# Patient Record
Sex: Female | Born: 1990 | Race: Black or African American | Hispanic: No | Marital: Single | Smoking: Never smoker
Health system: Southern US, Community
[De-identification: ages and names within clinical notes are randomized; demographics above are authoritative.]

## PROBLEM LIST (undated history)

## (undated) DIAGNOSIS — Z789 Other specified health status: Secondary | ICD-10-CM

## (undated) HISTORY — PX: NO PAST SURGERIES: SHX2092

---

## 2011-03-08 LAB — OB RESULTS CONSOLE GC/CHLAMYDIA: Chlamydia: NEGATIVE

## 2011-03-09 ENCOUNTER — Inpatient Hospital Stay (HOSPITAL_COMMUNITY): Admission: AD | Admit: 2011-03-09 | Payer: Self-pay | Source: Ambulatory Visit | Admitting: Obstetrics and Gynecology

## 2011-09-05 LAB — OB RESULTS CONSOLE RPR: RPR: NONREACTIVE

## 2011-09-05 LAB — OB RESULTS CONSOLE GC/CHLAMYDIA
Chlamydia: NEGATIVE
Chlamydia: NEGATIVE
Gonorrhea: NEGATIVE

## 2011-09-05 LAB — OB RESULTS CONSOLE HEPATITIS B SURFACE ANTIGEN: Hepatitis B Surface Ag: NEGATIVE

## 2011-09-05 LAB — OB RESULTS CONSOLE ANTIBODY SCREEN: Antibody Screen: NEGATIVE

## 2011-09-05 LAB — OB RESULTS CONSOLE HIV ANTIBODY (ROUTINE TESTING): HIV: NONREACTIVE

## 2011-09-05 LAB — OB RESULTS CONSOLE RUBELLA ANTIBODY, IGM: Rubella: IMMUNE

## 2012-01-21 LAB — OB RESULTS CONSOLE GBS: GBS: POSITIVE

## 2012-02-06 NOTE — L&D Delivery Note (Signed)
Delivery Note Pt progressed to complete and pushed well.  At 6:24 PM a viable female was delivered via Vaginal, Spontaneous Delivery (Presentation: Left Occiput Anterior).  APGAR: 8, 9; weight pending.   Placenta status: Intact, Spontaneous.  Cord: 3 vessels with the following complications: None.  Anesthesia: Epidural  Episiotomy: None Lacerations: 1st degree Suture Repair: 3.0 vicryl rapide Est. Blood Loss (mL): 300  Mom to postpartum.  Baby to stay with mom.  Daphnee Preiss D 02/20/2012, 6:51 PM

## 2012-02-20 ENCOUNTER — Encounter (HOSPITAL_COMMUNITY): Payer: Self-pay

## 2012-02-20 ENCOUNTER — Encounter (HOSPITAL_COMMUNITY): Payer: Self-pay | Admitting: Anesthesiology

## 2012-02-20 ENCOUNTER — Inpatient Hospital Stay (HOSPITAL_COMMUNITY): Payer: Medicaid Other | Admitting: Anesthesiology

## 2012-02-20 ENCOUNTER — Inpatient Hospital Stay (HOSPITAL_COMMUNITY)
Admission: RE | Admit: 2012-02-20 | Discharge: 2012-02-22 | DRG: 775 | Disposition: A | Discharge status: Home / Self Care | Payer: Medicaid Other | Source: Ambulatory Visit | Attending: Obstetrics and Gynecology | Admitting: Obstetrics and Gynecology

## 2012-02-20 DIAGNOSIS — Z22330 Carrier of Group B streptococcus: Secondary | ICD-10-CM

## 2012-02-20 DIAGNOSIS — Z3490 Encounter for supervision of normal pregnancy, unspecified, unspecified trimester: Secondary | ICD-10-CM

## 2012-02-20 DIAGNOSIS — O99892 Other specified diseases and conditions complicating childbirth: Secondary | ICD-10-CM | POA: Diagnosis present

## 2012-02-20 HISTORY — DX: Other specified health status: Z78.9

## 2012-02-20 LAB — CBC
HCT: 32.4 % — ABNORMAL LOW (ref 36.0–46.0)
MCV: 82.7 fL (ref 78.0–100.0)
RDW: 14 % (ref 11.5–15.5)
WBC: 10.6 10*3/uL — ABNORMAL HIGH (ref 4.0–10.5)

## 2012-02-20 MED ORDER — LIDOCAINE HCL (PF) 1 % IJ SOLN
30.0000 mL | INTRAMUSCULAR | Status: DC | PRN
  Filled 2012-02-20: qty 30

## 2012-02-20 MED ORDER — CITRIC ACID-SODIUM CITRATE 334-500 MG/5ML PO SOLN: 30.0000 mL | ORAL | Status: DC | PRN

## 2012-02-20 MED ORDER — PHENYLEPHRINE 40 MCG/ML (10ML) SYRINGE FOR IV PUSH (FOR BLOOD PRESSURE SUPPORT): 80.0000 ug | PREFILLED_SYRINGE | INTRAVENOUS | Status: DC | PRN

## 2012-02-20 MED ORDER — IBUPROFEN 600 MG PO TABS
600.0000 mg | ORAL_TABLET | Freq: Four times a day (QID) | ORAL | Status: DC
  Administered 2012-02-21 – 2012-02-22 (×6): 600 mg via ORAL
  Filled 2012-02-20 (×6): qty 1

## 2012-02-20 MED ORDER — LACTATED RINGERS IV SOLN
500.0000 mL | Freq: Once | INTRAVENOUS | Status: DC
Start: 2012-02-20 — End: 2012-02-20

## 2012-02-20 MED ORDER — ZOLPIDEM TARTRATE 5 MG PO TABS: 5.0000 mg | ORAL_TABLET | Freq: Every evening | ORAL | Status: DC | PRN

## 2012-02-20 MED ORDER — DIBUCAINE 1 % RE OINT: 1.0000 "application " | TOPICAL_OINTMENT | RECTAL | Status: DC | PRN

## 2012-02-20 MED ORDER — PRENATAL MULTIVITAMIN CH
1.0000 | ORAL_TABLET | Freq: Every day | ORAL | Status: DC
  Administered 2012-02-21 – 2012-02-22 (×2): 1 via ORAL
  Filled 2012-02-20 (×2): qty 1

## 2012-02-20 MED ORDER — ONDANSETRON HCL 4 MG PO TABS: 4.0000 mg | ORAL_TABLET | ORAL | Status: DC | PRN

## 2012-02-20 MED ORDER — DIPHENHYDRAMINE HCL 50 MG/ML IJ SOLN: 12.5000 mg | INTRAMUSCULAR | Status: DC | PRN

## 2012-02-20 MED ORDER — MEASLES, MUMPS & RUBELLA VAC ~~LOC~~ INJ: 0.5000 mL | INJECTION | Freq: Once | SUBCUTANEOUS | Status: DC

## 2012-02-20 MED ORDER — BENZOCAINE-MENTHOL 20-0.5 % EX AERO
1.0000 "application " | INHALATION_SPRAY | CUTANEOUS | Status: DC | PRN
  Administered 2012-02-21: 1 via TOPICAL
  Filled 2012-02-20: qty 56

## 2012-02-20 MED ORDER — ONDANSETRON HCL 4 MG/2ML IJ SOLN: 4.0000 mg | Freq: Four times a day (QID) | INTRAMUSCULAR | Status: DC | PRN

## 2012-02-20 MED ORDER — OXYTOCIN 40 UNITS IN LACTATED RINGERS INFUSION - SIMPLE MED
1.0000 m[IU]/min | INTRAVENOUS | Status: DC
  Administered 2012-02-20: 2 m[IU]/min via INTRAVENOUS
  Filled 2012-02-20: qty 1000

## 2012-02-20 MED ORDER — IBUPROFEN 600 MG PO TABS: 600.0000 mg | ORAL_TABLET | Freq: Four times a day (QID) | ORAL | Status: DC | PRN

## 2012-02-20 MED ORDER — PHENYLEPHRINE 40 MCG/ML (10ML) SYRINGE FOR IV PUSH (FOR BLOOD PRESSURE SUPPORT)
80.0000 ug | PREFILLED_SYRINGE | INTRAVENOUS | Status: DC | PRN
  Filled 2012-02-20: qty 5

## 2012-02-20 MED ORDER — LACTATED RINGERS IV SOLN
INTRAVENOUS | Status: DC
  Administered 2012-02-20: 09:00:00 via INTRAVENOUS

## 2012-02-20 MED ORDER — EPHEDRINE 5 MG/ML INJ
10.0000 mg | INTRAVENOUS | Status: DC | PRN
  Filled 2012-02-20: qty 4

## 2012-02-20 MED ORDER — SIMETHICONE 80 MG PO CHEW: 80.0000 mg | CHEWABLE_TABLET | ORAL | Status: DC | PRN

## 2012-02-20 MED ORDER — PENICILLIN G POTASSIUM 5000000 UNITS IJ SOLR
2.5000 10*6.[IU] | INTRAVENOUS | Status: DC
  Administered 2012-02-20: 2.5 10*6.[IU] via INTRAVENOUS
  Filled 2012-02-20 (×5): qty 2.5

## 2012-02-20 MED ORDER — LACTATED RINGERS IV SOLN: 500.0000 mL | INTRAVENOUS | Status: DC | PRN

## 2012-02-20 MED ORDER — TERBUTALINE SULFATE 1 MG/ML IJ SOLN: 0.2500 mg | Freq: Once | INTRAMUSCULAR | Status: DC | PRN

## 2012-02-20 MED ORDER — FENTANYL 2.5 MCG/ML BUPIVACAINE 1/10 % EPIDURAL INFUSION (WH - ANES)
14.0000 mL/h | INTRAMUSCULAR | Status: DC
Start: 2012-02-20 — End: 2012-02-20
  Administered 2012-02-20: 14 mL/h via EPIDURAL
  Filled 2012-02-20: qty 125

## 2012-02-20 MED ORDER — BUTORPHANOL TARTRATE 1 MG/ML IJ SOLN
1.0000 mg | INTRAMUSCULAR | Status: DC | PRN
  Administered 2012-02-20 (×2): 1 mg via INTRAVENOUS
  Filled 2012-02-20 (×2): qty 1

## 2012-02-20 MED ORDER — OXYCODONE-ACETAMINOPHEN 5-325 MG PO TABS
1.0000 | ORAL_TABLET | ORAL | Status: DC | PRN
  Administered 2012-02-21 (×2): 1 via ORAL
  Filled 2012-02-20 (×2): qty 1

## 2012-02-20 MED ORDER — TETANUS-DIPHTH-ACELL PERTUSSIS 5-2.5-18.5 LF-MCG/0.5 IM SUSP: 0.5000 mL | Freq: Once | INTRAMUSCULAR | Status: DC

## 2012-02-20 MED ORDER — OXYCODONE-ACETAMINOPHEN 5-325 MG PO TABS: 1.0000 | ORAL_TABLET | ORAL | Status: DC | PRN

## 2012-02-20 MED ORDER — MAGNESIUM HYDROXIDE 400 MG/5ML PO SUSP: 30.0000 mL | ORAL | Status: DC | PRN

## 2012-02-20 MED ORDER — SENNOSIDES-DOCUSATE SODIUM 8.6-50 MG PO TABS
2.0000 | ORAL_TABLET | Freq: Every day | ORAL | Status: DC
  Administered 2012-02-20 – 2012-02-21 (×2): 2 via ORAL

## 2012-02-20 MED ORDER — ONDANSETRON HCL 4 MG/2ML IJ SOLN: 4.0000 mg | INTRAMUSCULAR | Status: DC | PRN

## 2012-02-20 MED ORDER — DIPHENHYDRAMINE HCL 25 MG PO CAPS: 25.0000 mg | ORAL_CAPSULE | Freq: Four times a day (QID) | ORAL | Status: DC | PRN

## 2012-02-20 MED ORDER — METHYLERGONOVINE MALEATE 0.2 MG PO TABS: 0.2000 mg | ORAL_TABLET | ORAL | Status: DC | PRN

## 2012-02-20 MED ORDER — PENICILLIN G POTASSIUM 5000000 UNITS IJ SOLR
5.0000 10*6.[IU] | Freq: Once | INTRAVENOUS | Status: AC
  Administered 2012-02-20: 5 10*6.[IU] via INTRAVENOUS
  Filled 2012-02-20: qty 5

## 2012-02-20 MED ORDER — WITCH HAZEL-GLYCERIN EX PADS: 1.0000 "application " | MEDICATED_PAD | CUTANEOUS | Status: DC | PRN

## 2012-02-20 MED ORDER — METHYLERGONOVINE MALEATE 0.2 MG/ML IJ SOLN: 0.2000 mg | INTRAMUSCULAR | Status: DC | PRN

## 2012-02-20 MED ORDER — ACETAMINOPHEN 325 MG PO TABS: 650.0000 mg | ORAL_TABLET | ORAL | Status: DC | PRN

## 2012-02-20 MED ORDER — LANOLIN HYDROUS EX OINT: TOPICAL_OINTMENT | CUTANEOUS | Status: DC | PRN

## 2012-02-20 MED ORDER — LIDOCAINE HCL (PF) 1 % IJ SOLN
INTRAMUSCULAR | Status: DC | PRN
  Administered 2012-02-20 (×4): 4 mL

## 2012-02-20 MED ORDER — EPHEDRINE 5 MG/ML INJ: 10.0000 mg | INTRAVENOUS | Status: DC | PRN

## 2012-02-20 MED ORDER — OXYTOCIN BOLUS FROM INFUSION: 500.0000 mL | INTRAVENOUS | Status: DC

## 2012-02-20 MED ORDER — OXYTOCIN 40 UNITS IN LACTATED RINGERS INFUSION - SIMPLE MED
62.5000 mL/h | INTRAVENOUS | Status: DC
  Administered 2012-02-20: 62.5 mL/h via INTRAVENOUS

## 2012-02-20 NOTE — Anesthesia Preprocedure Evaluation (Signed)

## 2012-02-20 NOTE — Progress Notes (Signed)
Comfortable with epidural Afeb, VSS FHT- Cat II, occ variable, ctx q 2-3 min VE- Rim/C/+1 Continue pitocin, anticipate SVD

## 2012-02-20 NOTE — H&P (Signed)
Gina Blair is a 22 y.o. female, G1 P0, EGA 40+ weeks with [facility] 1-12 presenting for elective induction.  Prenatal care essentially uncomplicated, see prenatal records for complete history.  Maternal Medical History:  Fetal activity: Perceived fetal activity is normal.    Prenatal complications: no prenatal complications   OB History    Grav Para Term Preterm Abortions TAB SAB Ect Mult Living   1 0 0 0 0 0 0 0 0 0      Past Medical History  Diagnosis Date  . No pertinent past medical history    Past Surgical History  Procedure Date  . No past surgeries    Family History: family history is not on file. Social History:  reports that she has never smoked. She does not have any smokeless tobacco history on file. She reports that she does not drink alcohol or use illicit drugs.   Prenatal Transfer Tool  Maternal Diabetes: No Genetic Screening: Normal Maternal Ultrasounds/Referrals: Normal Fetal Ultrasounds or other Referrals:  None Maternal Substance Abuse:  No Significant Maternal Medications:  None Significant Maternal Lab Results:  Lab values include: Group B Strep positive Other Comments:  None  Review of Systems  Respiratory: Negative.   Cardiovascular: Negative.    AROM clear Dilation: 2.5 Effacement (%): 90 Station: -1 Exam by:: J.Thornton, RN Blood pressure 103/51, pulse 114, temperature 98 F (36.7 C), temperature source Oral, resp. rate 18, height 5\' 3"  (1.6 m), weight 68.04 kg (150 lb), last menstrual period 05/13/2011. Maternal Exam:  Uterine Assessment: Contraction strength is moderate.  Contraction frequency is irregular.   Abdomen: Patient reports no abdominal tenderness. Estimated fetal weight is 7 lbs.   Fetal presentation: vertex  Introitus: Normal vulva. Normal vagina.  Amniotic fluid character: clear.  Pelvis: adequate for delivery.   Cervix: Cervix evaluated by digital exam.     Fetal Exam Fetal Monitor Review: Mode: ultrasound.     Baseline rate: 140.  Variability: moderate (6-25 bpm).   Pattern: accelerations present and no decelerations.    Fetal State Assessment: Category I - tracings are normal.     Physical Exam  Constitutional: She appears well-developed and well-nourished.  Cardiovascular: Normal rate, regular rhythm and normal heart sounds.   No murmur heard. Respiratory: Effort normal and breath sounds normal. No respiratory distress. She has no wheezes.  GI: Soft.       Gravid     Prenatal labs: ABO, Rh: B/Positive/-- (07/31 0000) Antibody: Negative (07/31 0000) Rubella: Immune (07/31 0000) RPR: Nonreactive (07/31 0000)  HBsAg: Negative (07/31 0000)  HIV: Non-reactive (07/31 0000)  GBS: Positive, Positive (12/16 0000)  GCT:  Nl  Assessment/Plan: IUP at 40+ weeks for induction.  On pitocin, AROM done, monitor progress and anticipate SVD.   Gina Blair D 02/20/2012, 12:20 PM

## 2012-02-20 NOTE — Anesthesia Procedure Notes (Signed)
Epidural Patient location during procedure: OB Start time: 02/20/2012 3:43 PM  Staffing Performed by: anesthesiologist   Preanesthetic Checklist Completed: patient identified, site marked, surgical consent, pre-op evaluation, timeout performed, IV checked, risks and benefits discussed and monitors and equipment checked  Epidural Patient position: sitting Prep: site prepped and draped and DuraPrep Patient monitoring: continuous pulse ox and blood pressure Approach: midline Injection technique: LOR air  Needle:  Needle type: Tuohy  Needle gauge: 17 G Needle length: 9 cm and 9 Needle insertion depth: 5 cm cm Catheter type: closed end flexible Catheter size: 19 Gauge Catheter at skin depth: 10 cm Test dose: negative  Assessment Events: blood not aspirated, injection not painful, no injection resistance, negative IV test and no paresthesia  Additional Notes Discussed risk of headache, infection, bleeding, nerve injury and failed or incomplete block.  Patient voices understanding and wishes to proceed. Reason for block:procedure for pain

## 2012-02-21 NOTE — Progress Notes (Signed)
Ur chart review completed.  

## 2012-02-21 NOTE — Progress Notes (Signed)
PPD #1 No problems Afeb, VSS Fundus firm, NT at U-1 Continue routine postpartum care 

## 2012-02-21 NOTE — Anesthesia Postprocedure Evaluation (Signed)
  Anesthesia Post-op Note  Patient: Gina Blair  Procedure(s) Performed: * No procedures listed *  Patient Location: Mother/Baby  Anesthesia Type:Epidural  Level of Consciousness: awake  Airway and Oxygen Therapy: Patient Spontanous Breathing  Post-op Pain: none  Post-op Assessment: Patient's Cardiovascular Status Stable, Respiratory Function Stable, Patent Airway, No signs of Nausea or vomiting, Adequate PO intake, Pain level controlled, No headache, No backache, No residual numbness and No residual motor weakness  Post-op Vital Signs: Reviewed and stable  Complications: No apparent anesthesia complications

## 2012-02-22 MED ORDER — IBUPROFEN 600 MG PO TABS: 600.0000 mg | ORAL_TABLET | Freq: Four times a day (QID) | ORAL | Status: DC

## 2012-02-22 MED ORDER — OXYCODONE-ACETAMINOPHEN 5-325 MG PO TABS: 1.0000 | ORAL_TABLET | ORAL | Status: DC | PRN

## 2012-02-22 NOTE — Discharge Summary (Signed)
Obstetric Discharge Summary Reason for Admission: induction of labor Prenatal Procedures: none Intrapartum Procedures: spontaneous vaginal delivery Postpartum Procedures: none Complications-Operative and Postpartum: 1st degree perineal laceration Hemoglobin  Date Value Range Status  02/20/2012 10.5* 12.0 - 15.0 g/dL Final     HCT  Date Value Range Status  02/20/2012 32.4* 36.0 - 46.0 % Final    Physical Exam:  General: alert Lochia: appropriate Uterine Fundus: firm  Discharge Diagnoses: Term Pregnancy-delivered  Discharge Information: Date: 02/22/2012 Activity: pelvic rest Diet: routine Medications: Ibuprofen and Percocet Condition: stable Instructions: refer to practice specific booklet Discharge to: home Follow-up Information    Follow up with Keyanah Kozicki D, MD. Schedule an appointment as soon as possible for a visit in 6 weeks.   Contact information:   [address] [place] [address] [phone]          Newborn Data: Live born female  Birth Weight: 7 lb 1.4 oz (3215 g) APGAR: 8, 9  Home with mother.  Gina Blair D 02/22/2012, 8:30 AM

## 2012-02-22 NOTE — Progress Notes (Signed)
PPD #2 No problems Afeb, VSS D/c home 

## 2013-12-07 ENCOUNTER — Encounter (HOSPITAL_COMMUNITY): Payer: Self-pay

## 2013-12-21 ENCOUNTER — Emergency Department (HOSPITAL_COMMUNITY)
Admission: EM | Admit: 2013-12-21 | Discharge: 2013-12-22 | Disposition: A | Discharge status: Home / Self Care | Payer: Self-pay | Attending: Emergency Medicine | Admitting: Emergency Medicine

## 2013-12-21 ENCOUNTER — Encounter (HOSPITAL_COMMUNITY): Payer: Self-pay | Admitting: Emergency Medicine

## 2013-12-21 DIAGNOSIS — Y9389 Activity, other specified: Secondary | ICD-10-CM | POA: Insufficient documentation

## 2013-12-21 DIAGNOSIS — Y998 Other external cause status: Secondary | ICD-10-CM | POA: Insufficient documentation

## 2013-12-21 DIAGNOSIS — S0993XA Unspecified injury of face, initial encounter: Secondary | ICD-10-CM

## 2013-12-21 DIAGNOSIS — W228XXA Striking against or struck by other objects, initial encounter: Secondary | ICD-10-CM | POA: Insufficient documentation

## 2013-12-21 DIAGNOSIS — S0230XA Fracture of orbital floor, unspecified side, initial encounter for closed fracture: Secondary | ICD-10-CM

## 2013-12-21 DIAGNOSIS — S023XXA Fracture of orbital floor, initial encounter for closed fracture: Secondary | ICD-10-CM | POA: Insufficient documentation

## 2013-12-21 DIAGNOSIS — Y9289 Other specified places as the place of occurrence of the external cause: Secondary | ICD-10-CM | POA: Insufficient documentation

## 2013-12-21 NOTE — ED Notes (Signed)
Bruising noted around left eye. Patient denies vision changes.  Reports light sensitivity has increased since incident four days ago.

## 2013-12-21 NOTE — ED Notes (Signed)
Pt states 3 days ago as she was getting in her car she caught her eye on the car door causing a laceration to her eyebrow area  Pt states her eye immediately swelled and bruised  Pt states she has been using ice on it and it has gotten better but she just wants it looked at to be sure it is ok

## 2013-12-21 NOTE — ED Provider Notes (Signed)
CSN: [id number]     Arrival date & time 12/21/13  2235 History   None    Chief Complaint  Patient [id number] with  . Eye Injury    Patient is a 23 y.o. female presenting with eye injury. The history is provided by the patient. No language interpreter was used.  Eye Injury Pertinent negatives include no headaches.   This chart was scribed for non-physician practitioner, Elpidio AnisShari Jahi Roza, PA-C working with Loren Raceravid Yelverton, MD, by Andrew Auaven Small, ED Scribe. This patient was seen in room WTR6/WTR6 and the patient's care was started at 11:50 PM.  Gina Blair is a 23 y.o. female who presents to the Emergency Department complaining of left eye injury that occurred 3 days ago. Pt states she hit her eye on the car door 3 days ago and her eye instantly went numb. She reports shortly after eye began to swell. Pt reports a pulling sensation when looking up. Pt reports associated eye redness, eye swelling, photophobia, and bruising. She denies injury else where.     Past Medical History  Diagnosis Date  . No pertinent past medical history    Past Surgical History  Procedure Laterality Date  . No past surgeries     History reviewed. No pertinent family history. History  Substance Use Topics  . Smoking status: Never Smoker   . Smokeless tobacco: Not on file  . Alcohol Use: Yes     Comment: occ   OB History    Gravida Para Term Preterm AB TAB SAB Ectopic Multiple Living   1 1 1  0 0 0 0 0 0 1     Review of Systems  Constitutional: Negative for fever.  Eyes: Positive for photophobia and redness.  Gastrointestinal: Negative for nausea and vomiting.  Musculoskeletal: Negative for neck pain.  Skin: Positive for color change.  Neurological: Negative for headaches.   Allergies  Review of patient's allergies indicates no known allergies.  Home Medications   Prior to Admission medications   Medication Sig Start Date End Date Taking? Authorizing Provider  ibuprofen (ADVIL,MOTRIN) 200 MG  tablet Take 400 mg by mouth every 6 (six) hours as needed (for pain.).   Yes Historical Provider, MD  ibuprofen (ADVIL,MOTRIN) 600 MG tablet Take 1 tablet (600 mg total) by mouth every 6 (six) hours. 02/22/12   Lavina Hammanodd Meisinger, MD  oxyCODONE-acetaminophen (PERCOCET/ROXICET) 5-325 MG per tablet Take 1-2 tablets by mouth every 4 (four) hours as needed (moderate - severe pain). 02/22/12   Lavina Hammanodd Meisinger, MD   BP 121/70 mmHg  Pulse 89  Temp(Src) 97.7 F (36.5 C) (Oral)  Resp 18  Ht 5\' 3"  (1.6 m)  Wt 120 lb (54.432 kg)  BMI 21.26 kg/m2  SpO2 98%  LMP 12/13/2013 (Approximate) Physical Exam  Constitutional: She is oriented to person, place, and time. She appears well-developed and well-nourished. No distress.  HENT:  Head: Normocephalic and atraumatic.  Eyes: Conjunctivae and EOM are normal.  Left eye has healing laceration to upper aspect of eyebrow. Ecchymosis to upper and lower lids and subconjunctival hemorrhage. cornea is clear. pupil painfully reactive but equal. No hyphema. Full ROM of EO muscles.  Neck: Neck supple.  Cardiovascular: Normal rate.   Pulmonary/Chest: Effort normal.  Musculoskeletal: Normal range of motion.  Neurological: She is alert and oriented to person, place, and time.  Skin: Skin is warm and dry.  Psychiatric: She has a normal mood and affect. Her behavior is normal.  Nursing note and vitals reviewed.   ED Course  Procedures (including critical care time) Labs Review Labs Reviewed - No data to display  Imaging Review No results found.   EKG Interpretation None     Ct Orbitss W/o Cm  12/22/2013   CLINICAL DATA:  Left orbital contusion following hitting face with car door, initial encounter  EXAM: CT ORBITS WITHOUT CONTRAST  TECHNIQUE: Multidetector CT imaging of the orbits was performed following the standard protocol without intravenous contrast.  COMPARISON:  None.  FINDINGS: There are changes consistent with a blowout fracture of the left orbit  involving the inferior wall and extending into the left maxillary antrum. There is no entrapment of the inferior rectus muscle. A small amount of herniated fat is seen. Air is noted within the left orbit related to the injury. Paranasal sinuses are otherwise within normal limits with the exception of a few small mucosal retention cysts in the maxillary antra bilaterally. The mastoid air cells are within normal limits. No other fracture is seen.  IMPRESSION: Left orbital blowout fracture without entrapment of the inferior rectus muscle. A small amount of herniated fat is seen. Air is noted within the left orbit consistent with the fracture. The globe is intact.   Electronically Signed   By: Alcide CleverMark  Lukens M.D.   On: 12/22/2013 00:34    MDM   Final diagnoses:  None   1. Left orbital blowout fracture with entrapment  Discussed with Dr. Emeline DarlingGore who advises patient can follow up in the office in the morning given that injury is now 714 days old. Care plan discussed with patient who is agreeable with plan.   I personally performed the services described in this documentation, which was scribed in my presence. The recorded information has been reviewed and is accurate.     Gina HookerShari A Pedrohenrique Mcconville, PA-C 12/23/13 0316  Loren Raceravid Yelverton, MD 12/26/13 [phone]

## 2013-12-22 ENCOUNTER — Emergency Department (HOSPITAL_COMMUNITY): Payer: Self-pay

## 2013-12-22 MED ORDER — HYDROCODONE-ACETAMINOPHEN 5-325 MG PO TABS
1.0000 | ORAL_TABLET | ORAL | Status: DC | PRN
Start: 2013-12-22 — End: 2020-08-20

## 2013-12-22 NOTE — ED Notes (Addendum)
Patient transported to CT 

## 2013-12-22 NOTE — ED Notes (Addendum)
Back from CT

## 2013-12-22 NOTE — Discharge Instructions (Signed)
Orbital Floor Fracture, Blowout  °The eye sits in the bony structure of the skull called the orbit. The upper and outside walls of the orbit are very thick and strong. These walls protect the eye if the head is struck from the top or side of the eye. However, the inside wall near the nose and the orbit floor are very thin and weak. The bony floor of the orbit also acts as the roof of the air-filled space (sinus) below the orbit.  ° ° °If the eye receives a direct blow from the front, all the tissues around the eye are briefly pressed together. This makes the orbital wall pressure very high. Since the weakest walls tend to give way first, the inside wall or the orbit floor may break. If the floor fractures, the tissues around the eye, including the muscle that is used to make the eye look down, may become trapped within the fracture as the floor of the orbit "blows out" into the sinus below.  °CAUSES  °Orbital floor fractures are caused by direct (blunt) trauma to the region of the eye.  °SYMPTOMS  °Assuming that there has been no injury to the eye itself, symptoms can include:  °Puffiness (swelling) and bruising around the eye area (black eye).  °A gurgling sound when pressure is placed on the eye area. This sound comes from air that has escaped from the sinus into the space around the eye (orbital emphysema).  °Seeing two of everything - one object being higher than the other (vertical diplopia). This is the result of the muscle that moves the eye down being trapped within the fracture. Since it cannot relax, the eye is being held in a downward position relative to the other eye and cannot look up. Vertical diplopia from an orbital floor fracture is worse when looking up.  °Pain around the eye when looking up.  °One eye looks sunken compared to the other eye (enophthalmos).  °Numbness of the cheek and upper gum on the same side of the face with the floor fracture. This is a result of nerve injury to these areas.  This nerve runs in a groove along the bone of the orbital floor on its way to the cheek and upper gums. °DIAGNOSIS  °The diagnosis of an orbital floor fracture is suspected during an eye exam by an ophthalmologist. It is confirmed by X-rays or CT scan of the eye region.  °TREATMENT  °Orbital floor fractures are not usually treated until all of the swelling around the eye has gone away. This may take 1 or 2 weeks. Once the swelling has gone down, an ophthalmologist will see if if the muscle below the eye is still trapped within the fracture.  °If there is no sign of a trapped muscle or vertical diplopia, treatment is not necessary.  °If there is double vision only when looking up, a decision may be made to not do anything since most people do not spend a lot of time looking up. This may depend on the person's profession. For instance, a plumber or electrician may spend a large part of their day looking up and would therefore need treatment.  °If there is persistent vertical double vision even when looking straight ahead, the ophthalmologist may try to free the muscle in the office. If this is unsuccessful, surgery is often needed. °SEEK IMMEDIATE MEDICAL CARE IF:  °You have had a blow to the region of your eyes and have:  °A drop in vision in   either eye.  °Swelling and bruising around either eye.  °One eye seems to be "sunken" compared to the other.  °You see two of everything with both eyes open when looking in any direction.  °The two images get further apart when looking in a certain direction - especially up.  °You have numbness of the cheek and upper gums on the side of the injury.  °You develop an unexplained oral temperature over 102° F (38.9° C), or as your caregiver suggests. °Document Released: 07/18/2000 Document Revised: 04/16/2011 Document Reviewed: 03/26/2013  °Exit[employer]® Patient Information ©2015 [employer]. This information is not intended to replace advice given to you by your health care provider.  Make sure you discuss any questions you have with your health care provider.  ° ° °

## 2020-08-19 ENCOUNTER — Other Ambulatory Visit: Payer: Self-pay

## 2020-08-19 ENCOUNTER — Encounter (HOSPITAL_COMMUNITY): Payer: Self-pay

## 2020-08-19 ENCOUNTER — Emergency Department (HOSPITAL_COMMUNITY)
Admission: EM | Admit: 2020-08-19 | Discharge: 2020-08-19 | Disposition: A | Discharge status: Home / Self Care | Payer: Self-pay | Attending: Emergency Medicine | Admitting: Emergency Medicine

## 2020-08-19 ENCOUNTER — Emergency Department (HOSPITAL_COMMUNITY)
Admission: EM | Admit: 2020-08-19 | Discharge: 2020-08-20 | Disposition: A | Discharge status: Home / Self Care | Payer: Self-pay | Attending: Emergency Medicine | Admitting: Emergency Medicine

## 2020-08-19 DIAGNOSIS — Z5321 Procedure and treatment not carried out due to patient leaving prior to being seen by health care provider: Secondary | ICD-10-CM | POA: Insufficient documentation

## 2020-08-19 DIAGNOSIS — N611 Abscess of the breast and nipple: Secondary | ICD-10-CM | POA: Insufficient documentation

## 2020-08-19 DIAGNOSIS — N6459 Other signs and symptoms in breast: Secondary | ICD-10-CM | POA: Insufficient documentation

## 2020-08-19 DIAGNOSIS — L0291 Cutaneous abscess, unspecified: Secondary | ICD-10-CM

## 2020-08-19 LAB — BASIC METABOLIC PANEL
Anion gap: 9 (ref 5–15)
BUN: 7 mg/dL (ref 6–20)
CO2: 21 mmol/L — ABNORMAL LOW (ref 22–32)
Calcium: 9.6 mg/dL (ref 8.9–10.3)
Chloride: 110 mmol/L (ref 98–111)
Creatinine, Ser: 0.56 mg/dL (ref 0.44–1.00)
GFR, Estimated: >60 mL/min (ref >60)
Glucose, Bld: 80 mg/dL (ref 70–99)
Potassium: 3.4 mmol/L — ABNORMAL LOW (ref 3.5–5.1)
Sodium: 140 mmol/L (ref 135–145)

## 2020-08-19 LAB — CBC WITH DIFFERENTIAL/PLATELET
Abs Immature Granulocytes: 0.01 10*3/uL (ref 0.00–0.07)
Basophils Absolute: 0 10*3/uL (ref 0.0–0.1)
Basophils Relative: 1 %
Eosinophils Absolute: 0.1 10*3/uL (ref 0.0–0.5)
Eosinophils Relative: 1 %
HCT: 41.7 % (ref 36.0–46.0)
Hemoglobin: 13.8 g/dL (ref 12.0–15.0)
Immature Granulocytes: 0 %
Lymphocytes Relative: 31 %
Lymphs Abs: 2.7 10*3/uL (ref 0.7–4.0)
MCH: 32 pg (ref 26.0–34.0)
MCHC: 33.1 g/dL (ref 30.0–36.0)
MCV: 96.8 fL (ref 80.0–100.0)
Monocytes Absolute: 0.7 10*3/uL (ref 0.1–1.0)
Monocytes Relative: 8 %
Neutro Abs: 5 10*3/uL (ref 1.7–7.7)
Neutrophils Relative %: 59 %
Platelets: 304 10*3/uL (ref 150–400)
RBC: 4.31 MIL/uL (ref 3.87–5.11)
RDW: 12.5 % (ref 11.5–15.5)
WBC: 8.5 10*3/uL (ref 4.0–10.5)
nRBC: 0 % (ref 0.0–0.2)

## 2020-08-19 LAB — I-STAT BETA HCG BLOOD, ED (MC, WL, AP ONLY): I-stat hCG, quantitative: <5 m[IU]/mL (ref <5)

## 2020-08-19 NOTE — ED Provider Notes (Signed)
Emergency Medicine Provider Triage Evaluation Note  Gina Blair , a 30 y.o. female  was evaluated in triage.  Pt complains of right breast pain associated with erythema x3 days. She was evaluated at [facility] prior to arrival and sent to the ED due to concerns about a possible abscess. No fever or chills. She had her nipples pierced 2 years ago. She endorses drainage from piercing. She is not currently breastfeeding.  Review of Systems  Positive: Color change Negative: fever  Physical Exam  BP 112/65   Pulse 83   Temp 98.9 F (37.2 C) (Oral)   Resp 16   SpO2 96%  Gen:   Awake, no distress   Resp:  Normal effort  MSK:   Moves extremities without difficulty  Other:  Mild erythema to right breast surrounding areola  Medical Decision Making  Medically screening exam initiated at 9:49 PM.  Appropriate orders placed.  FOSTER SONNIER was informed that the remainder of the evaluation will be completed by another provider, this initial triage assessment does not replace that evaluation, and the importance of remaining in the ED until their evaluation is complete.     Gina Blair 08/19/20 2152    Pricilla Loveless, MD 08/23/20 [phone]

## 2020-08-19 NOTE — ED Triage Notes (Signed)
Pt came in with R breast pain and redness. Started approx three days ago. D/c noted

## 2020-08-19 NOTE — ED Notes (Signed)
Patient gave registration her stickers and said she was leaving. Registration brought stickers back to RN.

## 2020-08-19 NOTE — ED Triage Notes (Signed)
Patient has inflammation, swelling, drainage out of her right breast. Patient has had nipple piercing's in both breast 2 years ago. Pain started 2 days ago and she took ibuprofen but it did not improve the pain. Patient has not changed the piercing's recently.

## 2020-08-20 ENCOUNTER — Emergency Department (HOSPITAL_COMMUNITY): Payer: Self-pay

## 2020-08-20 MED ORDER — DOXYCYCLINE HYCLATE 100 MG PO CAPS: 100.0000 mg | ORAL_CAPSULE | Freq: Two times a day (BID) | ORAL | 0 refills | Status: AC

## 2020-08-20 MED ORDER — OXYCODONE-ACETAMINOPHEN 5-325 MG PO TABS: 1.0000 | ORAL_TABLET | Freq: Four times a day (QID) | ORAL | 0 refills | Status: AC | PRN

## 2020-08-20 MED ORDER — OXYCODONE-ACETAMINOPHEN 5-325 MG PO TABS
1.0000 | ORAL_TABLET | Freq: Once | ORAL | Status: AC
  Administered 2020-08-20: 1 via ORAL
  Filled 2020-08-20: qty 1

## 2020-08-20 MED ORDER — DOXYCYCLINE HYCLATE 100 MG PO TABS
100.0000 mg | ORAL_TABLET | Freq: Once | ORAL | Status: AC
  Administered 2020-08-20: 100 mg via ORAL
  Filled 2020-08-20: qty 1

## 2020-08-20 NOTE — ED Notes (Signed)
Ultrasound at bedside

## 2020-08-20 NOTE — ED Provider Notes (Signed)
[facility]-EMERGENCY DEPT Provider Note: Lowella Dell, MD, FACEP  CSN: [id number] MRN: [id number] ARRIVAL: 08/19/20 at 2100 ROOM: WA16/WA16   CHIEF COMPLAINT  Breast Pain   HISTORY OF PRESENT ILLNESS  08/20/20 12:22 AM Gina Blair is a 30 y.o. female with about a 1 week history of breast pain that is worsened over the past 3 days.  The pain is located in the subareolar region and is associated with some erythema and induration.  She is also had some brownish nipple discharge.  She rates the pain is an 8 out of 10, worse with palpation.  She has not had fever or chills.  She was seen at an urgent care earlier and was sent here for evaluation for an abscess.  She is not currently lactating.  She has nipple piercings but these were placed 2 years ago.   Past Medical History:  Diagnosis Date   No pertinent past medical history     Past Surgical History:  Procedure Laterality Date   NO PAST SURGERIES      No family history on file.  Social History   Tobacco Use   Smoking status: Never  Substance Use Topics   Alcohol use: Yes    Comment: occ   Drug use: No    Prior to Admission medications   Medication Sig Start Date End Date Taking? Authorizing Provider  HYDROcodone-acetaminophen (NORCO/VICODIN) 5-325 MG per tablet Take 1-2 tablets by mouth every 4 (four) hours as needed. 12/22/13   Elpidio Anis, PA-C  ibuprofen (ADVIL,MOTRIN) 200 MG tablet Take 400 mg by mouth every 6 (six) hours as needed (for pain.).    [provider]  ibuprofen (ADVIL,MOTRIN) 600 MG tablet Take 1 tablet (600 mg total) by mouth every 6 (six) hours. 02/22/12   Meisinger, Tawanna Cooler, MD  oxyCODONE-acetaminophen (PERCOCET/ROXICET) 5-325 MG per tablet Take 1-2 tablets by mouth every 4 (four) hours as needed (moderate - severe pain). 02/22/12   Meisinger, Tawanna Cooler, MD    Allergies Patient has no known allergies.   REVIEW OF SYSTEMS  Negative except as noted here or in the History of Present  Illness.   PHYSICAL EXAMINATION  Initial Vital Signs Blood pressure 117/82, pulse 73, temperature 98.9 F (37.2 C), temperature source Oral, resp. rate 16, SpO2 100 %, unknown if currently breastfeeding.  Examination General: Well-developed, well-nourished female in no acute distress; appearance consistent with age of record HENT: normocephalic; atraumatic Eyes: Normal appearance Neck: supple Heart: regular rate and rhythm Lungs: clear to auscultation bilaterally Breasts: Symmetric in appearance; tenderness and induration of the areolar and periareolar region of the right breast without appreciable fluctuance; mild ring of erythema surrounding areola on the right; metal piercings are present in both nipples; no nipple discharge seen Abdomen: soft; nondistended Extremities: No deformity; full range of motion Neurologic: Awake, alert and oriented; motor function intact in all extremities and symmetric; no facial droop Skin: Warm and dry Psychiatric: Normal mood and affect   RESULTS  Summary of this visit's results, reviewed and interpreted by myself:   EKG Interpretation  Date/Time:    Ventricular Rate:    PR Interval:    QRS Duration:   QT Interval:    QTC Calculation:   R Axis:     Text Interpretation:         Laboratory Studies: Results for orders placed or performed during the hospital encounter of 08/19/20 (from the past 24 hour(s))  CBC with Differential     Status: None   Collection  Time: 08/19/20  9:50 PM  Result Value Ref Range   WBC 8.5 4.0 - 10.5 K/uL   RBC 4.31 3.87 - 5.11 MIL/uL   Hemoglobin 13.8 12.0 - 15.0 g/dL   HCT 19.1 47.8 - 29.5 %   MCV 96.8 80.0 - 100.0 fL   MCH 32.0 26.0 - 34.0 pg   MCHC 33.1 30.0 - 36.0 g/dL   RDW 62.1 30.8 - 65.7 %   Platelets 304 150 - 400 K/uL   nRBC 0.0 0.0 - 0.2 %   Neutrophils Relative % 59 %   Neutro Abs 5.0 1.7 - 7.7 K/uL   Lymphocytes Relative 31 %   Lymphs Abs 2.7 0.7 - 4.0 K/uL   Monocytes Relative 8 %    Monocytes Absolute 0.7 0.1 - 1.0 K/uL   Eosinophils Relative 1 %   Eosinophils Absolute 0.1 0.0 - 0.5 K/uL   Basophils Relative 1 %   Basophils Absolute 0.0 0.0 - 0.1 K/uL   Immature Granulocytes 0 %   Abs Immature Granulocytes 0.01 0.00 - 0.07 K/uL  Basic metabolic panel     Status: Abnormal   Collection Time: 08/19/20  9:50 PM  Result Value Ref Range   Sodium 140 135 - 145 mmol/L   Potassium 3.4 (L) 3.5 - 5.1 mmol/L   Chloride 110 98 - 111 mmol/L   CO2 21 (L) 22 - 32 mmol/L   Glucose, Bld 80 70 - 99 mg/dL   BUN 7 6 - 20 mg/dL   Creatinine, Ser 8.46 0.44 - 1.00 mg/dL   Calcium 9.6 8.9 - 96.2 mg/dL   GFR, Estimated >95 >28 mL/min   Anion gap 9 5 - 15  I-Stat Beta hCG blood, ED ([facility], [facility], [facility] only)     Status: None   Collection Time: 08/19/20 10:12 PM  Result Value Ref Range   I-stat hCG, quantitative <5.0 <5 mIU/mL   Comment 3           Imaging Studies: US BREAST LTD UNI RIGHT INC AXILLA  Result Date: 08/20/2020 CLINICAL DATA:  Retroareolar right breast pain. Recent breast piercing. EXAM: LIMITED ULTRASOUND OF THE RIGHT BREAST COMPARISON:  None FINDINGS: Limited sonography was performed of the Ariel region in the area maximal tenderness as reported by the patient. There is a complex retroareolar lobulated fluid collection containing moderate echogenic debris measuring 2.1 x 1.4 x 1.2 cm demonstrating mild peripheral hyperemia most in keeping with a retroareolar abscess. AA curvilinear foreign body demonstrates ring down artifact within the superficial soft tissues extending to the areola likely representing the patient's reported breast piercing. IMPRESSION: 2.1 cm retroareolar breast abscess. Electronically Signed   By: Helyn Numbers MD   On: 08/20/2020 02:12    ED COURSE and MDM  Nursing notes, initial and subsequent vitals signs, including pulse oximetry, reviewed and interpreted by myself.  Vitals:   08/20/20 0030 08/20/20 0100 08/20/20 0207 08/20/20 0300  BP: 119/84 117/81  121/75 109/61  Pulse: 82 62 72 71  Resp:  16 15 16   Temp:      TempSrc:      SpO2: 100% 100% 100% 98%   Medications  doxycycline (VIBRA-TABS) tablet 100 mg (has no administration in time range)  oxyCODONE-acetaminophen (PERCOCET/ROXICET) 5-325 MG per tablet 1 tablet (1 tablet Oral Given 08/20/20 0207)   Dr. 08/22/20 of general surgery was consulted.  He advises that surgical I&D of breast abscesses are rarely done because patients are better served by aspiration of the abscess at the  [facility].  We will have her follow-up Monday (the day after tomorrow) and treat her with antibiotics and pain medication in the meantime.  She was advised to remove her nipple piercing in the meantime.  The patient was unable to express any mammary fluid for culture.   PROCEDURES  Procedures   ED DIAGNOSES     ICD-10-CM   1. Subareolar breast abscess  N61.1                             Jung Yurchak, Jonny Ruiz, MD 08/20/20 [phone]

## 2020-08-20 NOTE — ED Notes (Signed)
Patient unable to provide bodily fluid culture for gram stain culture.

## 2020-08-22 ENCOUNTER — Telehealth: Payer: Self-pay | Admitting: Surgery

## 2020-08-22 NOTE — Telephone Encounter (Signed)
ED RNCM contacted patient and provided her with the information for [facility] [phone], to follow up for breast abscess.

## 2020-08-23 NOTE — Progress Notes (Signed)
08/22/20  12:34pm  CSW contacted [facility] to setup PCP appointment for pt. Appointment was set for October, 17th at 9:50. CSW confirmed with pt of appointment     08/23/20 10:48am  Pt contacted CSW and reported the breast center was unable to help her due to her not having cancer, pt reported they referred her to [facility] CSW provided pt with the information for [facility] [phone], to follow up for breast abscess.  Valentina Shaggy.Tashunda Vandezande, MSW, LCSWA [facility]  Transitions of Care Clinical Social Worker I Direct Dial: [phone]  Fax: [phone] [email or web address]

## 2020-08-24 ENCOUNTER — Other Ambulatory Visit: Payer: Self-pay | Admitting: General Surgery

## 2020-08-24 DIAGNOSIS — N644 Mastodynia: Secondary | ICD-10-CM

## 2020-08-26 ENCOUNTER — Other Ambulatory Visit: Payer: Self-pay | Admitting: Student

## 2020-08-26 ENCOUNTER — Other Ambulatory Visit: Payer: Self-pay | Admitting: General Surgery

## 2020-08-26 DIAGNOSIS — N644 Mastodynia: Secondary | ICD-10-CM

## 2020-08-29 ENCOUNTER — Other Ambulatory Visit: Payer: Self-pay | Admitting: Student

## 2020-08-29 DIAGNOSIS — N644 Mastodynia: Secondary | ICD-10-CM

## 2020-08-29 DIAGNOSIS — N611 Abscess of the breast and nipple: Secondary | ICD-10-CM

## 2020-09-09 ENCOUNTER — Other Ambulatory Visit: Payer: Self-pay

## 2020-09-09 DIAGNOSIS — N611 Abscess of the breast and nipple: Secondary | ICD-10-CM

## 2020-09-09 DIAGNOSIS — N644 Mastodynia: Secondary | ICD-10-CM

## 2020-09-13 ENCOUNTER — Other Ambulatory Visit: Payer: Self-pay | Admitting: Obstetrics and Gynecology

## 2020-09-13 ENCOUNTER — Ambulatory Visit: Payer: Self-pay

## 2020-09-13 DIAGNOSIS — N644 Mastodynia: Secondary | ICD-10-CM

## 2020-09-16 ENCOUNTER — Telehealth: Payer: Self-pay

## 2020-09-16 ENCOUNTER — Other Ambulatory Visit: Payer: Self-pay

## 2020-09-16 NOTE — Telephone Encounter (Addendum)
Patient  called and stated that [facility] has [facility], and was told because she missed her [facility] appointment her appointment with the [facility] was canceled. Patient was upset, crying, stated she did not know that she had to be seen in the [facility] clinic prior to today's appointment at the [facility]. Patient stated that her [employer] 27 Bronze plan did start on 09/05/2020, and she was seen by her OB-Gyn yesterday, who did a breast exam, prescribed Augmentin. Patient informed that [facility] cannot cover the visit, but I can contact the [facility] to see if she can file her BCBS plan, but will need to contact her OB-Gyn for orders. I spoke with Cordelia Pen @ [facility], who then directed to me to Cherish @ [facility], who informed me that the appointment had been scheduled for another patient, not sure at this point what can be done for patient, have patient to contact OB-Gyn to have to contact the breast center, or send a referral with orders, confirmed that [facility] is not providing orders, must use insurance. I contacted patient again, informed patient that she must contact her OB-Gyn, have them contact the [facility]. Patient verbalized understanding, stated she has contacted her ob-gyn's office, was closed, waiting on the after hours/ on-call nurse to call. Patient also stated that after taking antibiotics for the possible abscess, she had some burning in her breasts, really needs area drained, not sure why needs so many scans/ultrasounds. Patient informed needs to discuss with on-call after hours nurse, and if needed, can go to urgent care. Informed about different locations including [facility] and [facility], call as needed. Patient agreed. During conversation patient only complained of pain and discomfort with possible breast abscess, did not complain of any distress nor did patient seem to be in any distress.

## 2020-11-21 ENCOUNTER — Ambulatory Visit: Payer: Self-pay | Admitting: Family Medicine

## 2020-11-21 ENCOUNTER — Ambulatory Visit: Payer: Self-pay | Admitting: Family

## 2022-12-03 IMAGING — US US BREAST*R* LIMITED INC AXILLA
1 series · 15 of 15 positions shown · non-contrast
Comparison: None

CLINICAL DATA: Retroareolar right breast pain. Recent breast
piercing.

EXAM:
LIMITED ULTRASOUND OF THE RIGHT BREAST

[Series 1: us brst ltd uni right inc trans [facility] · 15 acquisitions, 15 frames shown]
[im 1/15]
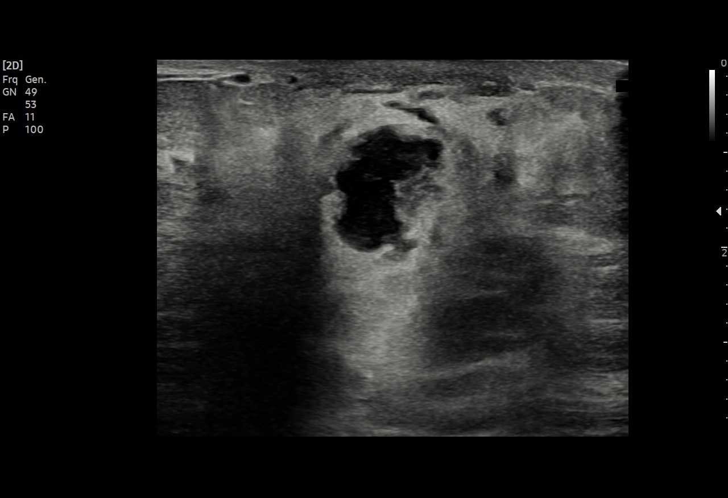
[im 2/15]
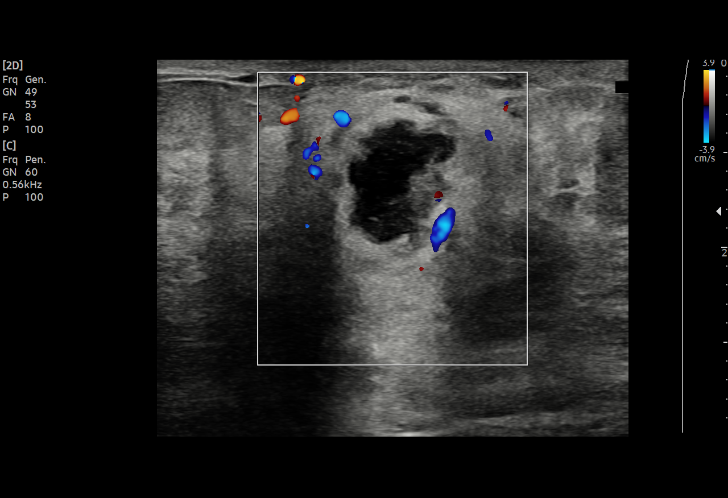
[im 3/15]
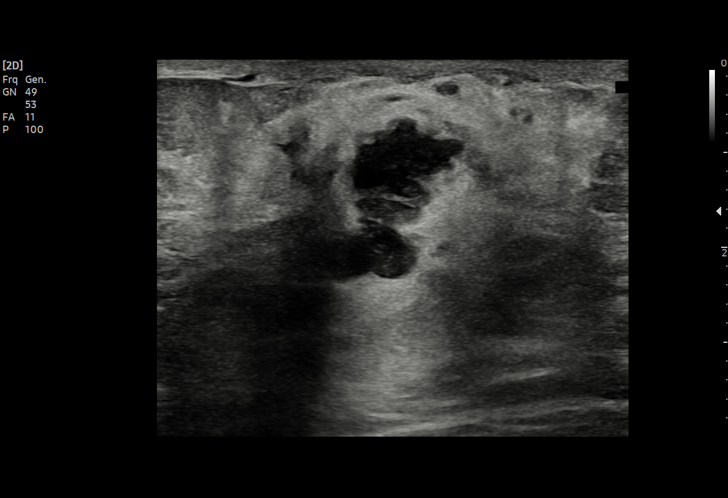
[im 4/15]
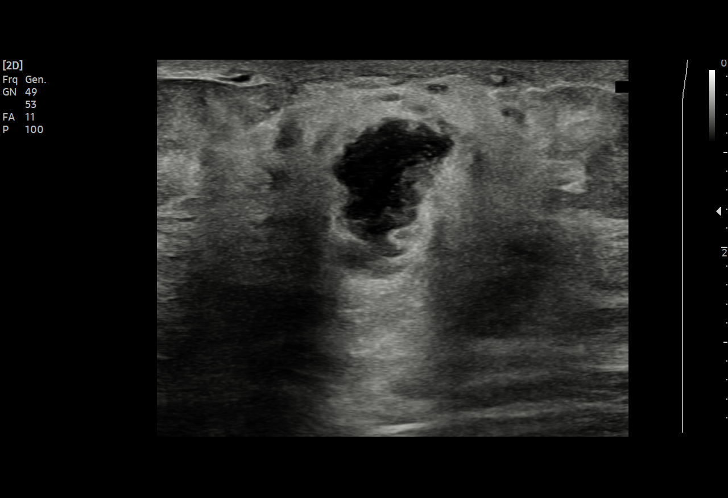
[im 5/15]
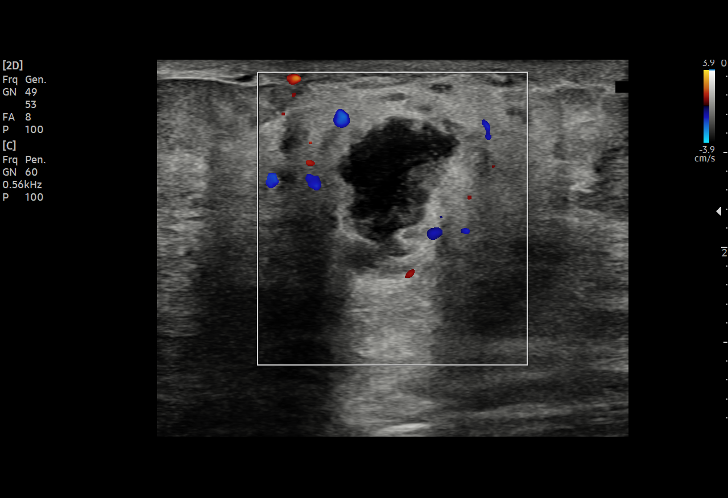
[im 6/15]
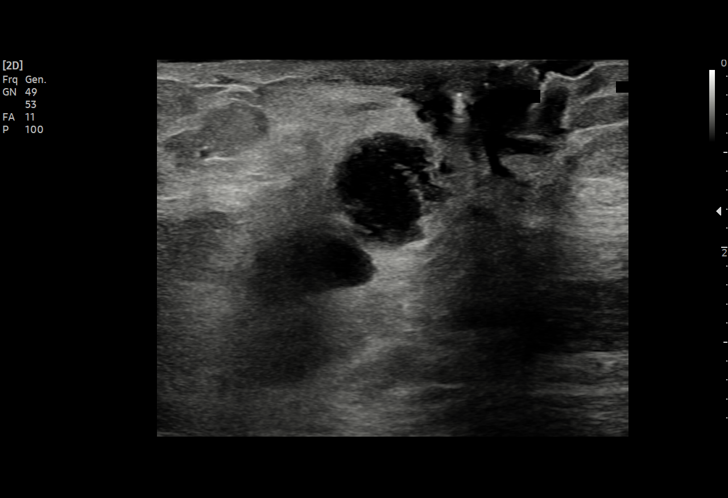
[im 7/15]
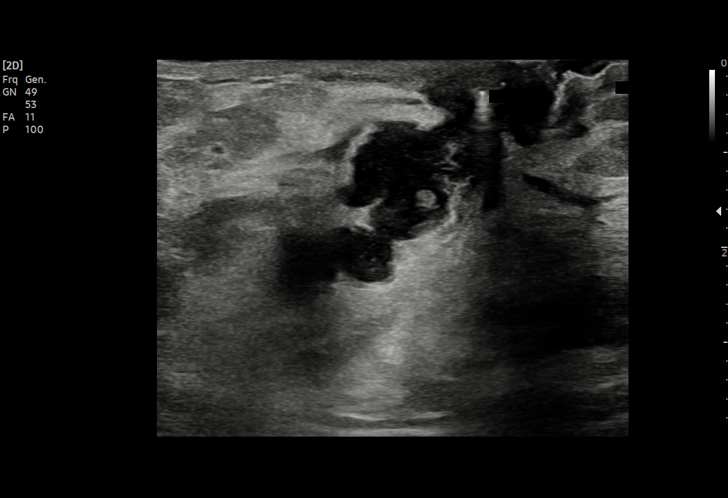
[im 8/15]
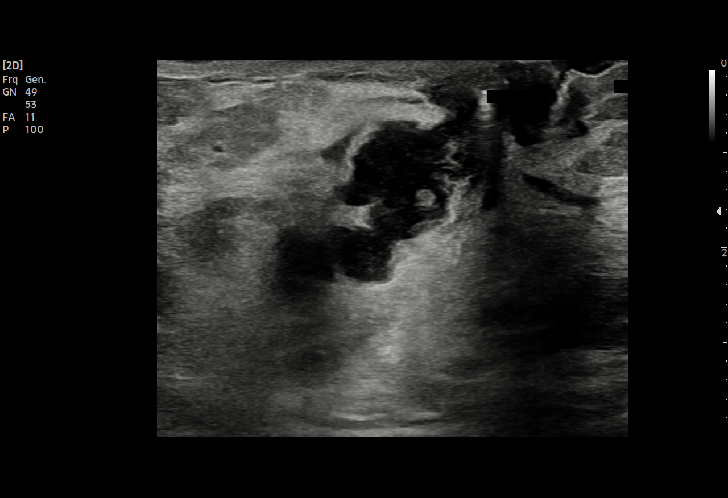
[im 9/15]
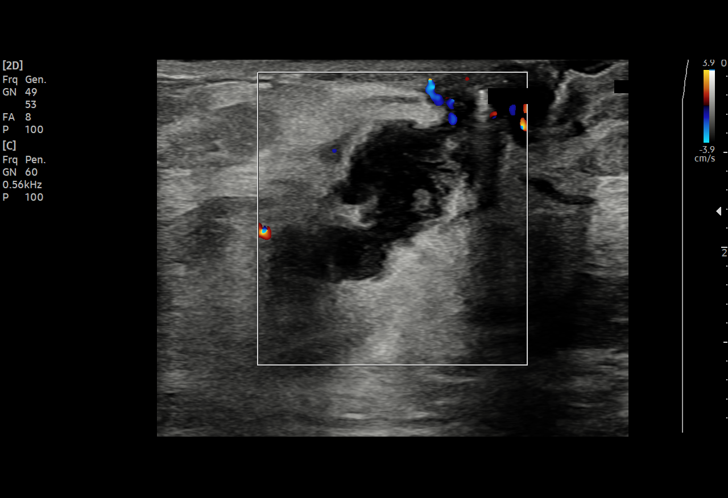
[im 10/15]
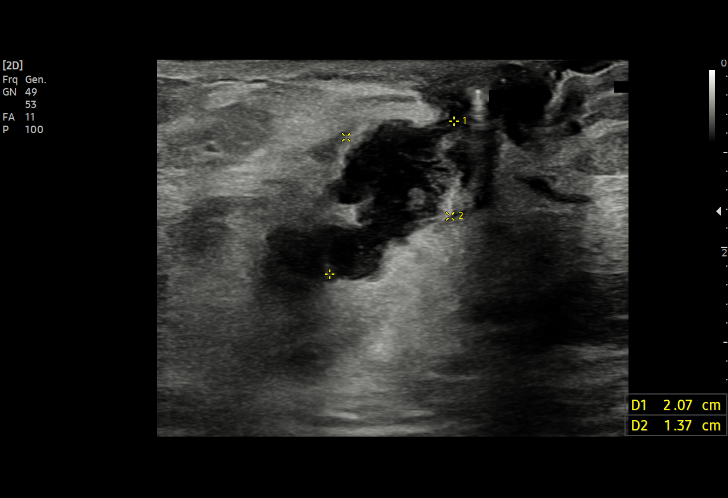
[im 11/15]
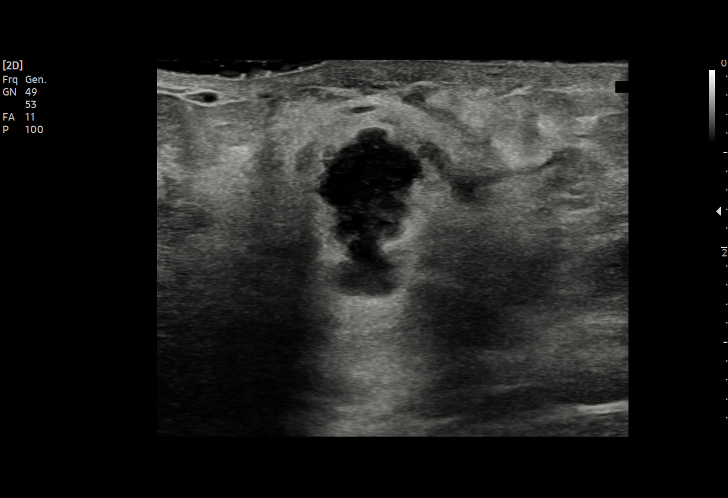
[im 12/15]
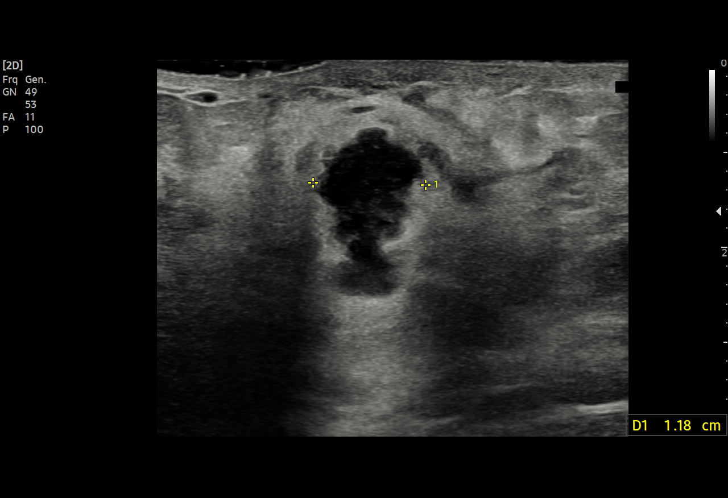
[im 13/15]
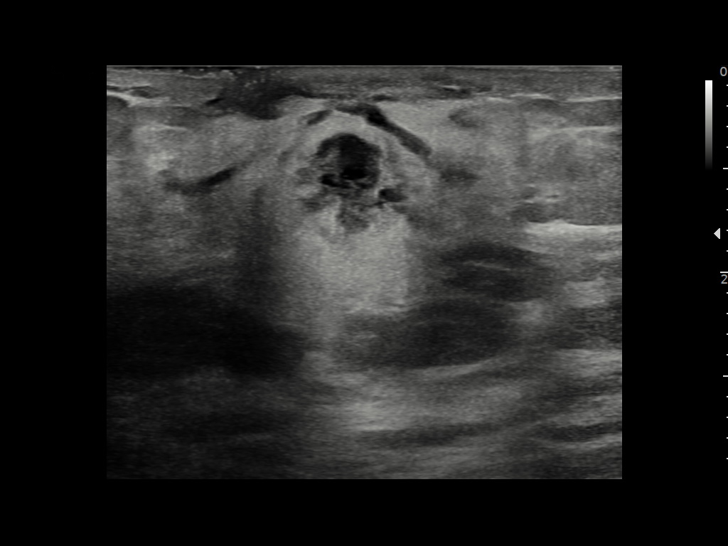
[im 14/15]
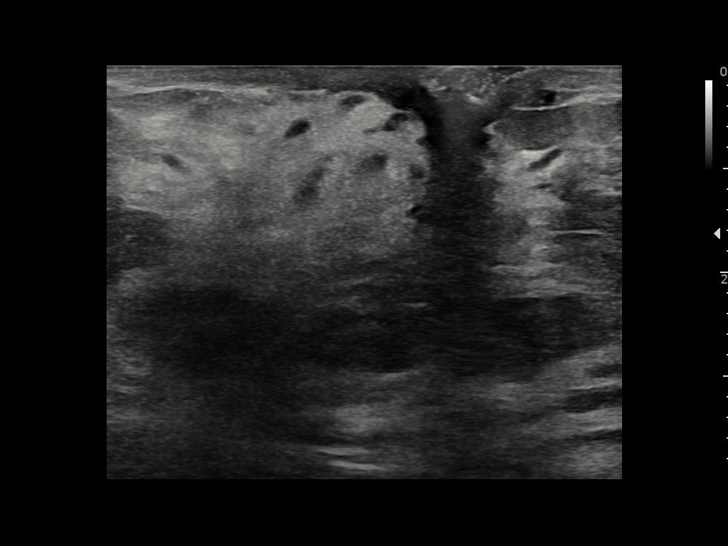
[im 15/15]
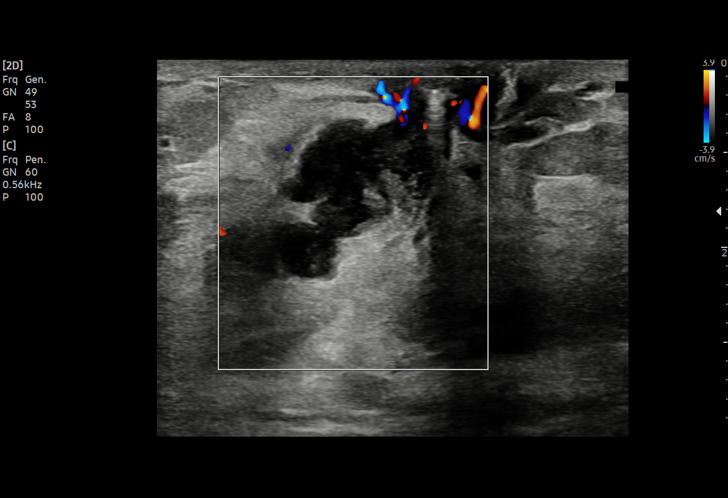

[15 of 15 positions shown; findings below may reference images not displayed]

FINDINGS: Limited sonography was performed of the Arissa region in the area
maximal tenderness as reported by the patient.

There is a complex retroareolar lobulated fluid collection
containing moderate echogenic debris measuring 2.1 x 1.4 x 1.2 cm
demonstrating mild peripheral hyperemia most in keeping with a
retroareolar abscess. AA curvilinear foreign body demonstrates ring
down artifact within the superficial soft tissues extending to the
areola likely representing the patient's reported breast piercing.
IMPRESSION: 2.1 cm retroareolar breast abscess.
